# Patient Record
Sex: Male | Born: 1975 | Race: Black or African American | Hispanic: No | Marital: Single | Smoking: Never smoker
Health system: Southern US, Community
[De-identification: ages and names within clinical notes are randomized; demographics above are authoritative.]

---

## 2006-03-15 ENCOUNTER — Emergency Department (HOSPITAL_COMMUNITY): Admission: EM | Admit: 2006-03-15 | Discharge: 2006-03-15 | Payer: Self-pay | Admitting: Emergency Medicine

## 2006-03-19 ENCOUNTER — Ambulatory Visit (HOSPITAL_COMMUNITY): Admission: RE | Admit: 2006-03-19 | Discharge: 2006-03-19 | Payer: Self-pay | Admitting: Orthopedic Surgery

## 2007-10-07 IMAGING — CR DG FINGER MIDDLE 2+V*L*
3 series · 3 of 3 positions shown · non-contrast
Comparison: none

CLINICAL DATA: Left middle finger swelling, status post injury.
 LEFT MIDDLE FINGER ? 3 VIEW:

[x finger pa left *]
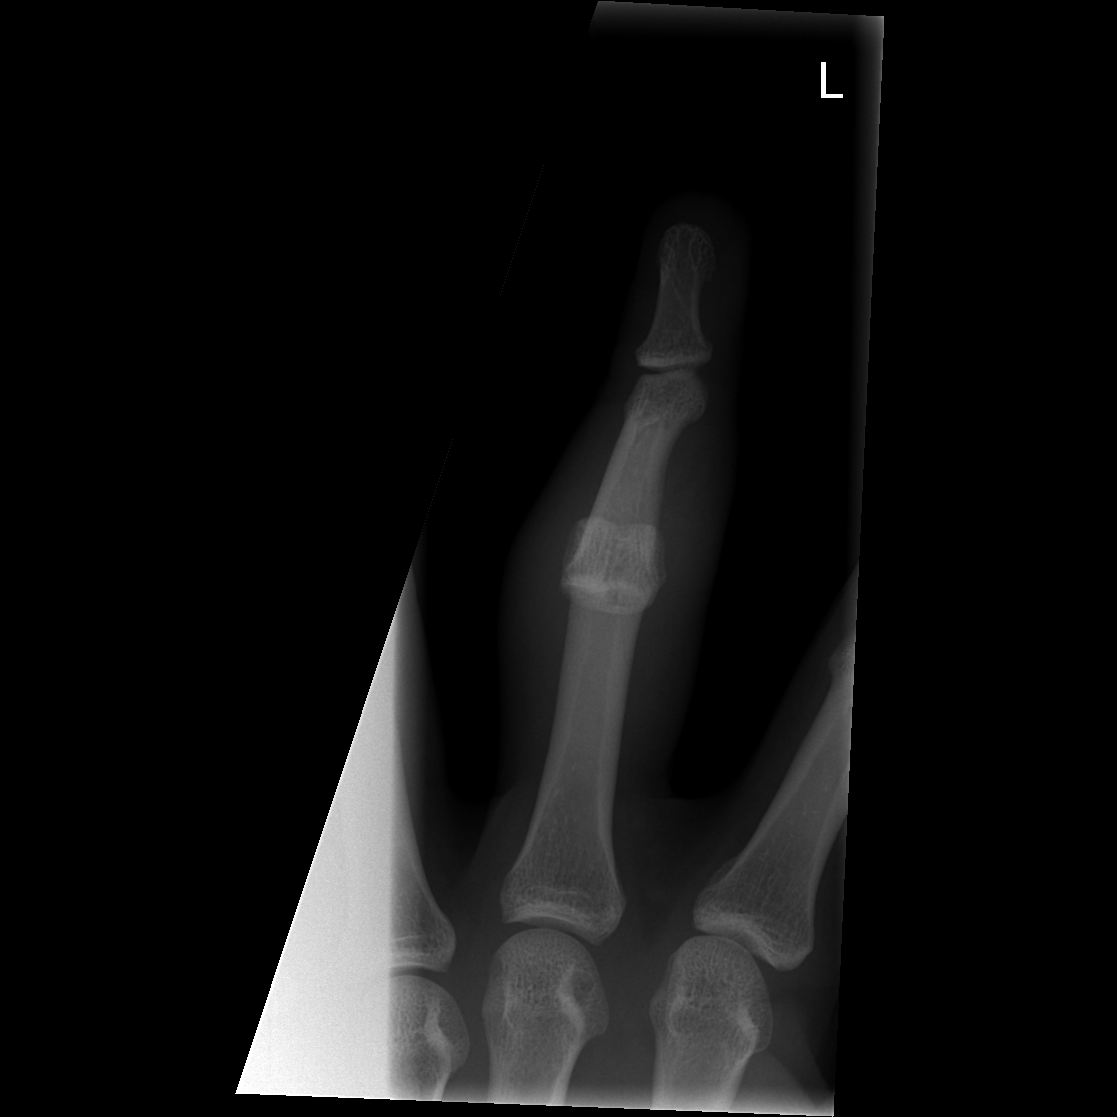

[x finger obl. left *]
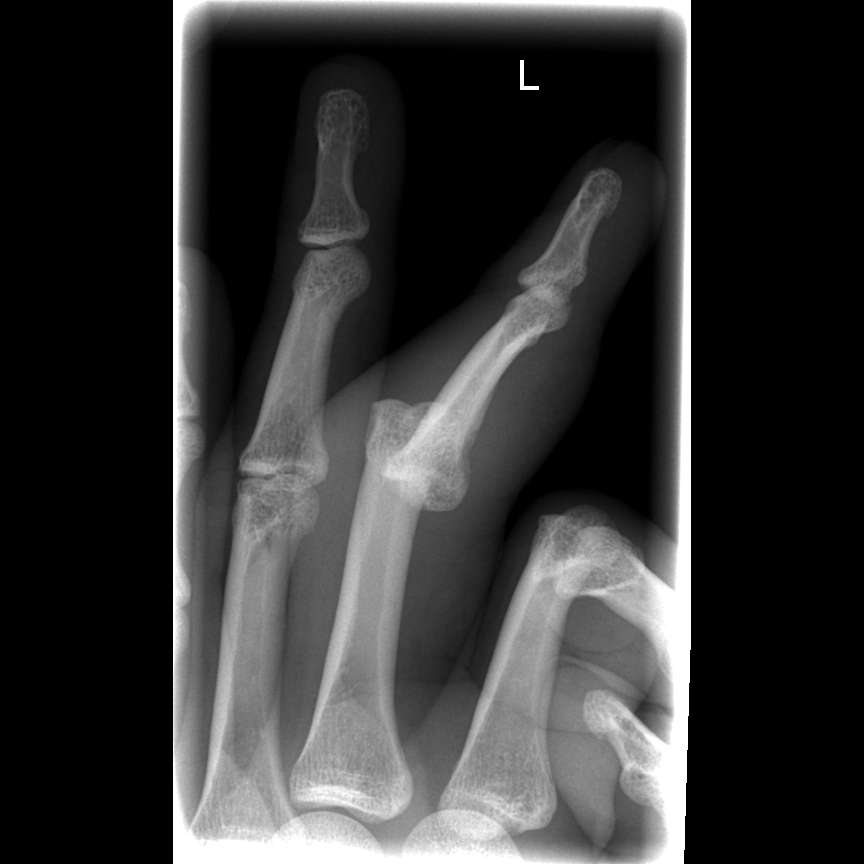

[x finger lateral left]
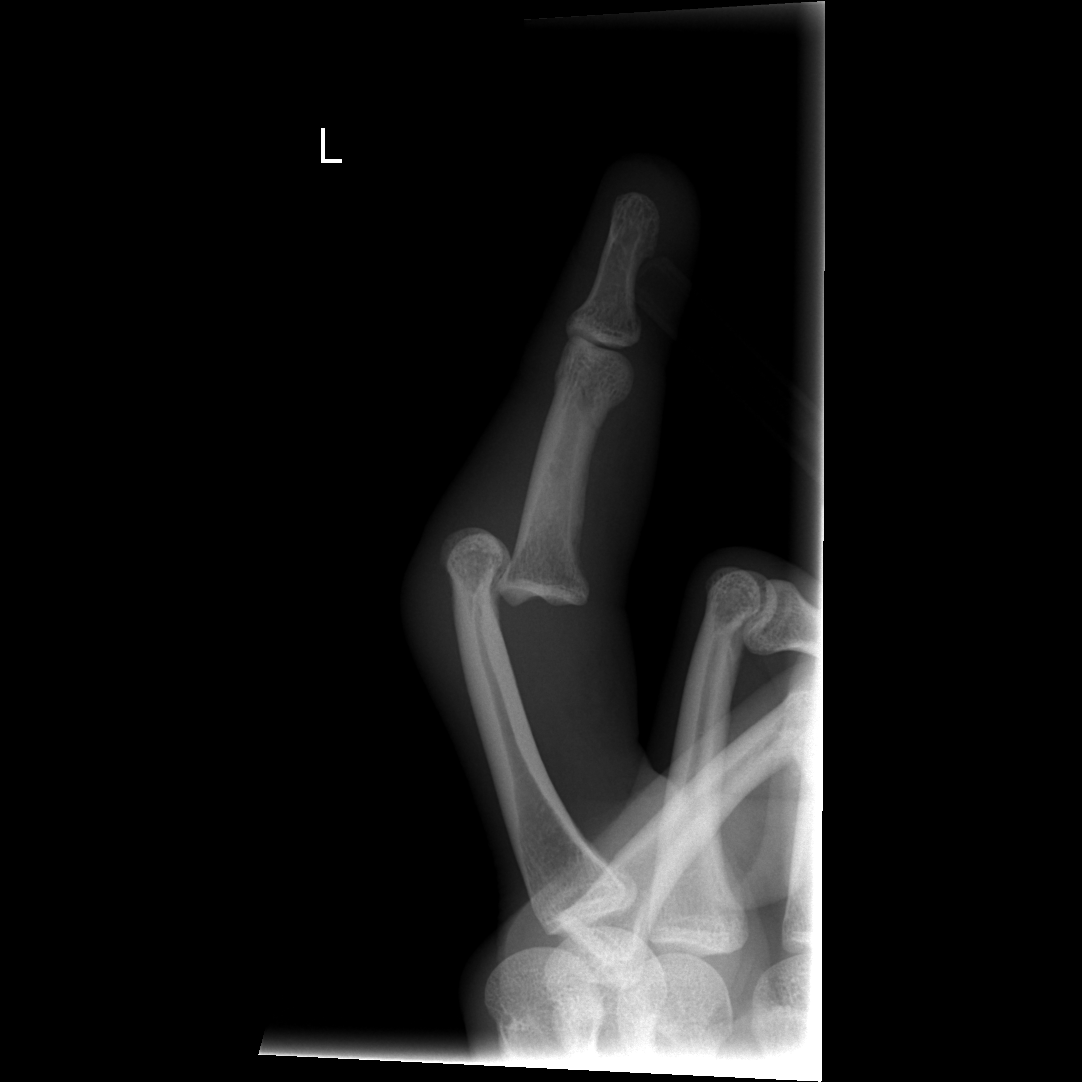

[3 of 3 positions shown; findings below may reference images not displayed]

FINDINGS: There is dislocation at the PIP joint.  There is no evidence of fracture.  The DIP joint is unremarkable.
IMPRESSION: PIP dislocation.

## 2020-02-09 ENCOUNTER — Ambulatory Visit: Payer: Medicaid Other | Admitting: Physician Assistant

## 2020-02-09 ENCOUNTER — Other Ambulatory Visit: Payer: Self-pay

## 2020-02-09 VITALS — BP 140/97 | HR 78 | Temp 98.7°F | Resp 18 | Ht 73.0 in | Wt 180.0 lb

## 2020-02-09 DIAGNOSIS — Z1159 Encounter for screening for other viral diseases: Secondary | ICD-10-CM

## 2020-02-09 DIAGNOSIS — Z114 Encounter for screening for human immunodeficiency virus [HIV]: Secondary | ICD-10-CM

## 2020-02-09 DIAGNOSIS — Z13220 Encounter for screening for lipoid disorders: Secondary | ICD-10-CM | POA: Diagnosis not present

## 2020-02-09 DIAGNOSIS — R030 Elevated blood-pressure reading, without diagnosis of hypertension: Secondary | ICD-10-CM | POA: Diagnosis not present

## 2020-02-09 DIAGNOSIS — Z0000 Encounter for general adult medical examination without abnormal findings: Secondary | ICD-10-CM

## 2020-02-09 NOTE — Progress Notes (Signed)
Patient has eaten today. Patient has taken no medication. Patient has no pain at this time.

## 2020-02-09 NOTE — Patient Instructions (Signed)
Please return to the mobile unit tomorrow morning or Thursday morning for fasting labs and to recheck your blood pressure.  Please check your blood pressure at home, keep a written log, and bring with you to your office visits.  Roney Jaffe, PA-C Physician Assistant [facility] [email or web address]  Health Maintenance, Male Adopting a healthy lifestyle and getting preventive care are important in promoting health and wellness. Ask your health care provider about:  The right schedule for you to have regular tests and exams.  Things you can do on your own to prevent diseases and keep yourself healthy. What should I know about diet, weight, and exercise? Eat a healthy diet   Eat a diet that includes plenty of vegetables, fruits, low-fat dairy products, and lean protein.  Do not eat a lot of foods that are high in solid fats, added sugars, or sodium. Maintain a healthy weight Body mass index (BMI) is a measurement that can be used to identify possible weight problems. It estimates body fat based on height and weight. Your health care provider can help determine your BMI and help you achieve or maintain a healthy weight. Get regular exercise Get regular exercise. This is one of the most important things you can do for your health. Most adults should:  Exercise for at least 150 minutes each week. The exercise should increase your heart rate and make you sweat (moderate-intensity exercise).  Do strengthening exercises at least twice a week. This is in addition to the moderate-intensity exercise.  Spend less time sitting. Even light physical activity can be beneficial. Watch cholesterol and blood lipids Have your blood tested for lipids and cholesterol at 42 years of age, then have this test every 5 years. You may need to have your cholesterol levels checked more often if:  Your lipid or cholesterol levels are high.  You are older  than 42 years of age.  You are at high risk for heart disease. What should I know about cancer screening? Many types of cancers can be detected early and may often be prevented. Depending on your health history and family history, you may need to have cancer screening at various ages. This may include screening for:  Colorectal cancer.  Prostate cancer.  Skin cancer.  Lung cancer. What should I know about heart disease, diabetes, and high blood pressure? Blood pressure and heart disease  High blood pressure causes heart disease and increases the risk of stroke. This is more likely to develop in people who have high blood pressure readings, are of African descent, or are overweight.  Talk with your health care provider about your target blood pressure readings.  Have your blood pressure checked: ? Every 3-5 years if you are 68-87 years of age. ? Every year if you are 75 years old or older.  If you are between the ages of 4 and 7 and are a current or former smoker, ask your health care provider if you should have a one-time screening for abdominal aortic aneurysm (AAA). Diabetes Have regular diabetes screenings. This checks your fasting blood sugar level. Have the screening done:  Once every three years after age 4 if you are at a normal weight and have a low risk for diabetes.  More often and at a younger age if you are overweight or have a high risk for diabetes. What should I know about preventing infection? Hepatitis B If you have a higher risk for hepatitis B, you should be screened for this  virus. Talk with your health care provider to find out if you are at risk for hepatitis B infection. Hepatitis C Blood testing is recommended for:  Everyone born from [birth year] through 1963.  Anyone with known risk factors for hepatitis C. Sexually transmitted infections (STIs)  You should be screened each year for STIs, including gonorrhea and chlamydia, if: ? You are sexually active  and are younger than 42 years of age. ? You are older than 42 years of age and your health care provider tells you that you are at risk for this type of infection. ? Your sexual activity has changed since you were last screened, and you are at increased risk for chlamydia or gonorrhea. Ask your health care provider if you are at risk.  Ask your health care provider about whether you are at high risk for HIV. Your health care provider may recommend a prescription medicine to help prevent HIV infection. If you choose to take medicine to prevent HIV, you should first get tested for HIV. You should then be tested every 3 months for as long as you are taking the medicine. Follow these instructions at home: Lifestyle  Do not use any products that contain nicotine or tobacco, such as cigarettes, e-cigarettes, and chewing tobacco. If you need help quitting, ask your health care provider.  Do not use street drugs.  Do not share needles.  Ask your health care provider for help if you need support or information about quitting drugs. Alcohol use  Do not drink alcohol if your health care provider tells you not to drink.  If you drink alcohol: ? Limit how much you have to 0-2 drinks a day. ? Be aware of how much alcohol is in your drink. In the [place] one drink equals one 12 oz bottle of beer (355 mL), one 5 oz glass of wine (148 mL), or one 1 oz glass of hard liquor (44 mL). General instructions  Schedule regular health, dental, and eye exams.  Stay current with your vaccines.  Tell your health care provider if: ? You often feel depressed. ? You have ever been abused or do not feel safe at home. Summary  Adopting a healthy lifestyle and getting preventive care are important in promoting health and wellness.  Follow your health care provider's instructions about healthy diet, exercising, and getting tested or screened for diseases.  Follow your health care provider's instructions on  monitoring your cholesterol and blood pressure. This information is not intended to replace advice given to you by your health care provider. Make sure you discuss any questions you have with your health care provider. Document Revised: 01/22/2018 Document Reviewed: 01/22/2018 Elsevier Patient Education  2020 [employer].

## 2020-02-09 NOTE — Progress Notes (Signed)
New Patient Office Visit  Subjective:  Patient ID: Evan Wheeler, male    DOB: [birth year]  Age: 44 y.o. MRN: [id number]  CC:  Chief Complaint  Patient presents with   Annual Exam    HPI Evan Wheeler presents for physical without complaint.  States that his last physical was approximately 3 to 4 years ago, during an incarceration.  Reports that everything was within normal limits at that time.  States that he does not check his blood pressure at home, has not previously been diagnosed with hypertension.  Denies any hypertensive symptoms.   History reviewed. No pertinent past medical history.  History reviewed. No pertinent surgical history.  History reviewed. No pertinent family history.  Social History   Socioeconomic History   Marital status: Single    Spouse name: Not on file   Number of children: Not on file   Years of education: Not on file   Highest education level: Not on file  Occupational History   Not on file  Tobacco Use   Smoking status: Never Smoker   Smokeless tobacco: Never Used  Substance and Sexual Activity   Alcohol use: Yes   Drug use: Not on file   Sexual activity: Not Currently  Other Topics Concern   Not on file  Social History Narrative   Not on file   Social Determinants of Health   Financial Resource Strain: Not on file  Food Insecurity: Not on file  Transportation Needs: Not on file  Physical Activity: Not on file  Stress: Not on file  Social Connections: Not on file  Intimate Partner Violence: Not on file    ROS Review of Systems  Constitutional: Negative for chills and fatigue.  HENT: Negative.   Eyes: Negative.   Respiratory: Negative for shortness of breath and wheezing.   Cardiovascular: Negative for chest pain.  Gastrointestinal: Negative for abdominal pain and constipation.  Endocrine: Negative.   Genitourinary: Negative for difficulty urinating.  Musculoskeletal: Negative.   Skin: Negative.    Allergic/Immunologic: Negative.   Neurological: Negative.   Hematological: Negative.   Psychiatric/Behavioral: Negative for sleep disturbance.    Objective:   Today's Vitals: BP (!) 140/97 (BP Location: Left Arm, Patient Position: Sitting, Cuff Size: Normal)    Pulse 78    Temp 98.7 F (37.1 C) (Oral)    Resp 18    Ht 6\' 1"  (1.854 m)    Wt 180 lb (81.6 kg)    SpO2 100%    BMI 23.75 kg/m   Physical Exam Vitals and nursing note reviewed.   BP (!) 140/97 (BP Location: Left Arm, Patient Position: Sitting, Cuff Size: Normal)    Pulse 78    Temp 98.7 F (37.1 C) (Oral)    Resp 18    Ht 6\' 1"  (1.854 m)    Wt 180 lb (81.6 kg)    SpO2 100%    BMI 23.75 kg/m   General Appearance:    Alert, cooperative, no distress, appears stated age  Head:    Normocephalic, without obvious abnormality, atraumatic  Eyes:    PERRL, conjunctiva/corneas clear, EOM's intact, fundi    benign, both eyes       Ears:    Normal TM's and external ear canals, both ears  Nose:   Nares normal, septum midline, mucosa normal, no drainage   or sinus tenderness  Throat:   Lips, mucosa, and tongue normal; teeth and gums normal  Neck:   Supple, symmetrical, trachea midline, no  adenopathy;       thyroid:  No enlargement/tenderness/nodules; no carotid   bruit or JVD  Back:     Symmetric, no curvature, ROM normal, no CVA tenderness  Lungs:     Clear to auscultation bilaterally, respirations unlabored  Chest wall:    No tenderness or deformity  Heart:    Regular rate and rhythm, S1 and S2 normal, no murmur, rub   or gallop  Abdomen:     Soft, non-tender, bowel sounds active all four quadrants,    no masses, no organomegaly        Extremities:   Extremities normal, atraumatic, no cyanosis or edema  Pulses:   2+ and symmetric all extremities  Skin:   Skin color, texture, turgor normal, no rashes or lesions  Lymph nodes:   Cervical, supraclavicular, and axillary nodes normal  Neurologic:   CNII-XII intact. Normal strength,  sensation and reflexes      throughout     Assessment & Plan:   Problem List Items Addressed This Visit      Other   Elevated blood pressure reading in office without diagnosis of hypertension   Relevant Orders   TSH    Other Visit Diagnoses    Wellness examination    -  Primary   Relevant Orders   CBC with Differential/Platelet   Comp. Metabolic Panel (12)   Screening, lipid       Relevant Orders   Lipid panel   Screening for HIV without presence of risk factors       Relevant Orders   HIV antibody (with reflex)   Encounter for HCV screening test for low risk patient       Relevant Orders   HCV Ab w/Rflx to Verification      No outpatient encounter medications on file as of 02/09/2020.   No facility-administered encounter medications on file as of 02/09/2020.  1. Wellness examination Patient to return to mobile unit for fasting labs.  Patient was given an appointment to establish care at [facility] on March 15, 2020 - CBC with Differential/Platelet; Future - Comp. Metabolic Panel (12); Future  2. Elevated blood pressure reading in office without diagnosis of hypertension Patient education given. Encouraged patient to check BP at home, keep written log, bring log to next office visit.  - TSH; Future  3. Screening, lipid - Lipid panel; Future  4. Screening for HIV without presence of risk factors  - HIV antibody (with reflex); Future  5. Encounter for HCV screening test for low risk patient  - HCV Ab w/Rflx to Verification; Future   I have reviewed the patient's medical history (PMH, PSH, Social History, Family History, Medications, and allergies) , and have been updated if relevant. I spent 30 minutes reviewing chart and  face to face time with patient.    Follow-up: Return in about 1 day (around 02/10/2020) for Fasting  labs.   Kasandra Knudsen Mayers, PA-C

## 2020-02-10 ENCOUNTER — Other Ambulatory Visit: Payer: Self-pay | Admitting: *Deleted

## 2020-02-10 ENCOUNTER — Other Ambulatory Visit: Payer: Medicaid Other

## 2020-02-10 DIAGNOSIS — Z0000 Encounter for general adult medical examination without abnormal findings: Secondary | ICD-10-CM

## 2020-02-10 DIAGNOSIS — Z1159 Encounter for screening for other viral diseases: Secondary | ICD-10-CM

## 2020-02-10 DIAGNOSIS — Z13220 Encounter for screening for lipoid disorders: Secondary | ICD-10-CM

## 2020-02-10 DIAGNOSIS — R030 Elevated blood-pressure reading, without diagnosis of hypertension: Secondary | ICD-10-CM

## 2020-02-10 DIAGNOSIS — Z114 Encounter for screening for human immunodeficiency virus [HIV]: Secondary | ICD-10-CM

## 2020-02-10 NOTE — Progress Notes (Signed)
Patient tolerated blood draw well today. 

## 2020-02-11 LAB — HCV INTERPRETATION

## 2020-02-11 LAB — LIPID PANEL
Chol/HDL Ratio: 2.6 ratio (ref 0.0–5.0)
Cholesterol, Total: 230 mg/dL — ABNORMAL HIGH (ref 100–199)
HDL: 90 mg/dL (ref >39)
LDL Chol Calc (NIH): 128 mg/dL — ABNORMAL HIGH (ref 0–99)
Triglycerides: 70 mg/dL (ref 0–149)
VLDL Cholesterol Cal: 12 mg/dL (ref 5–40)

## 2020-02-11 LAB — COMP. METABOLIC PANEL (12)
AST: 21 IU/L (ref 0–40)
Albumin/Globulin Ratio: 1.4 (ref 1.2–2.2)
Albumin: 4.2 g/dL (ref 4.0–5.0)
Alkaline Phosphatase: 67 IU/L (ref 44–121)
BUN/Creatinine Ratio: 14 (ref 9–20)
BUN: 16 mg/dL (ref 6–24)
Bilirubin Total: 2.4 mg/dL — ABNORMAL HIGH (ref 0.0–1.2)
Calcium: 9.8 mg/dL (ref 8.7–10.2)
Chloride: 104 mmol/L (ref 96–106)
Creatinine, Ser: 1.16 mg/dL (ref 0.76–1.27)
GFR calc Af Amer: 89 mL/min/{1.73_m2} (ref >59)
GFR calc non Af Amer: 77 mL/min/{1.73_m2} (ref >59)
Globulin, Total: 3 g/dL (ref 1.5–4.5)
Glucose: 102 mg/dL — ABNORMAL HIGH (ref 65–99)
Potassium: 3.9 mmol/L (ref 3.5–5.2)
Sodium: 142 mmol/L (ref 134–144)
Total Protein: 7.2 g/dL (ref 6.0–8.5)

## 2020-02-11 LAB — CBC WITH DIFFERENTIAL/PLATELET
Basophils Absolute: 0 10*3/uL (ref 0.0–0.2)
Basos: 1 %
EOS (ABSOLUTE): 0 10*3/uL (ref 0.0–0.4)
Eos: 1 %
Hematocrit: 48.6 % (ref 37.5–51.0)
Hemoglobin: 16.6 g/dL (ref 13.0–17.7)
Immature Grans (Abs): 0 10*3/uL (ref 0.0–0.1)
Immature Granulocytes: 0 %
Lymphocytes Absolute: 1.4 10*3/uL (ref 0.7–3.1)
Lymphs: 33 %
MCH: 31 pg (ref 26.6–33.0)
MCHC: 34.2 g/dL (ref 31.5–35.7)
MCV: 91 fL (ref 79–97)
Monocytes Absolute: 0.4 10*3/uL (ref 0.1–0.9)
Monocytes: 9 %
Neutrophils Absolute: 2.4 10*3/uL (ref 1.4–7.0)
Neutrophils: 56 %
Platelets: 147 10*3/uL — ABNORMAL LOW (ref 150–450)
RBC: 5.35 x10E6/uL (ref 4.14–5.80)
RDW: 12.9 % (ref 11.6–15.4)
WBC: 4.2 10*3/uL (ref 3.4–10.8)

## 2020-02-11 LAB — TSH: TSH: 0.947 u[IU]/mL (ref 0.450–4.500)

## 2020-02-11 LAB — HCV AB W/RFLX TO VERIFICATION: HCV Ab: <0.1 s/co ratio (ref 0.0–0.9)

## 2020-02-11 LAB — HIV ANTIBODY (ROUTINE TESTING W REFLEX): HIV Screen 4th Generation wRfx: NONREACTIVE

## 2020-02-15 NOTE — Progress Notes (Signed)
Patient needs appointment with [facility] this week to discuss labs

## 2020-02-29 ENCOUNTER — Telehealth: Payer: Self-pay | Admitting: *Deleted

## 2020-02-29 NOTE — Telephone Encounter (Signed)
Medical Assistant left message on patient's home and cell voicemail. Voicemail states to give a call back to Nubia with [facility] at [phone]. 

## 2020-03-15 ENCOUNTER — Encounter: Payer: Self-pay | Admitting: Family

## 2020-03-15 NOTE — Progress Notes (Signed)
Patient did not show for appointment.   

## 2020-03-17 ENCOUNTER — Telehealth: Payer: Self-pay | Admitting: *Deleted

## 2020-03-17 NOTE — Telephone Encounter (Signed)
MA UTR patient regarding labs. Communication letter has been drafted and mailed to patient.

## 2020-03-17 NOTE — Telephone Encounter (Signed)
-----   Message from Roney Jaffe, [place] sent at 03/15/2020  1:00 PM EST ----- Please send letter to patient requesting follow-up regarding lab results.

## 2020-03-28 ENCOUNTER — Telehealth: Payer: Self-pay | Admitting: *Deleted

## 2020-03-28 NOTE — Telephone Encounter (Signed)
MA unable to reach patient via telephone or mailing address. Communication letter was sent on 03/17/20 and returned to sender on 03/28/20

## 2023-10-13 ENCOUNTER — Encounter (HOSPITAL_COMMUNITY): Payer: Self-pay | Admitting: Radiology

## 2023-10-13 ENCOUNTER — Emergency Department (HOSPITAL_COMMUNITY): Payer: Self-pay

## 2023-10-13 ENCOUNTER — Emergency Department (HOSPITAL_COMMUNITY)
Admission: EM | Admit: 2023-10-13 | Discharge: 2023-10-13 | Disposition: A | Discharge status: Home / Self Care | Payer: Self-pay | Attending: Emergency Medicine | Admitting: Emergency Medicine

## 2023-10-13 ENCOUNTER — Other Ambulatory Visit: Payer: Self-pay

## 2023-10-13 DIAGNOSIS — W25XXXA Contact with sharp glass, initial encounter: Secondary | ICD-10-CM | POA: Insufficient documentation

## 2023-10-13 DIAGNOSIS — Z23 Encounter for immunization: Secondary | ICD-10-CM | POA: Insufficient documentation

## 2023-10-13 DIAGNOSIS — S51811A Laceration without foreign body of right forearm, initial encounter: Secondary | ICD-10-CM | POA: Insufficient documentation

## 2023-10-13 DIAGNOSIS — E876 Hypokalemia: Secondary | ICD-10-CM | POA: Insufficient documentation

## 2023-10-13 LAB — BASIC METABOLIC PANEL WITH GFR
Anion gap: 13 (ref 5–15)
BUN: 11 mg/dL (ref 6–20)
CO2: 22 mmol/L (ref 22–32)
Calcium: 9 mg/dL (ref 8.9–10.3)
Chloride: 107 mmol/L (ref 98–111)
Creatinine, Ser: 1.04 mg/dL (ref 0.61–1.24)
GFR, Estimated: >60 mL/min (ref >60)
Glucose, Bld: 105 mg/dL — ABNORMAL HIGH (ref 70–99)
Potassium: 3.3 mmol/L — ABNORMAL LOW (ref 3.5–5.1)
Sodium: 142 mmol/L (ref 135–145)

## 2023-10-13 LAB — CBC WITH DIFFERENTIAL/PLATELET
Abs Immature Granulocytes: 0.03 K/uL (ref 0.00–0.07)
Basophils Absolute: 0 K/uL (ref 0.0–0.1)
Basophils Relative: 0 %
Eosinophils Absolute: 0 K/uL (ref 0.0–0.5)
Eosinophils Relative: 0 %
HCT: 44.5 % (ref 39.0–52.0)
Hemoglobin: 14.7 g/dL (ref 13.0–17.0)
Immature Granulocytes: 0 %
Lymphocytes Relative: 27 %
Lymphs Abs: 2 K/uL (ref 0.7–4.0)
MCH: 31.7 pg (ref 26.0–34.0)
MCHC: 33 g/dL (ref 30.0–36.0)
MCV: 96.1 fL (ref 80.0–100.0)
Monocytes Absolute: 0.6 K/uL (ref 0.1–1.0)
Monocytes Relative: 8 %
Neutro Abs: 4.7 K/uL (ref 1.7–7.7)
Neutrophils Relative %: 65 %
Platelets: 181 K/uL (ref 150–400)
RBC: 4.63 MIL/uL (ref 4.22–5.81)
RDW: 12.7 % (ref 11.5–15.5)
WBC: 7.3 K/uL (ref 4.0–10.5)
nRBC: 0 % (ref 0.0–0.2)

## 2023-10-13 MED ORDER — TETANUS-DIPHTH-ACELL PERTUSSIS 5-2.5-18.5 LF-MCG/0.5 IM SUSY
0.5000 mL | PREFILLED_SYRINGE | Freq: Once | INTRAMUSCULAR | Status: AC
  Administered 2023-10-13: 0.5 mL via INTRAMUSCULAR
  Filled 2023-10-13: qty 0.5

## 2023-10-13 MED ORDER — LIDOCAINE-EPINEPHRINE-TETRACAINE (LET) TOPICAL GEL
3.0000 mL | Freq: Once | TOPICAL | Status: AC
  Administered 2023-10-13: 3 mL via TOPICAL
  Filled 2023-10-13: qty 3

## 2023-10-13 MED ORDER — LIDOCAINE HCL (PF) 1 % IJ SOLN
5.0000 mL | Freq: Once | INTRAMUSCULAR | Status: AC
  Administered 2023-10-13: 5 mL
  Filled 2023-10-13: qty 5

## 2023-10-13 NOTE — ED Notes (Signed)
 X-ray at bedside.
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
## 2023-10-13 NOTE — Discharge Instructions (Addendum)
 You were seen today for a laceration to your right forearm.  It was cleaned and had 3 sutures placed.  Please follow-up with urgent care, primary care or return to the ED in the next 7 days for suture removal.  Watch out for signs of infection which would include fever, spreading warmth or redness from the wound site, foul-smelling or white drainage, increased swelling or uncontrolled pain.
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
## 2023-10-26 ENCOUNTER — Other Ambulatory Visit: Payer: Self-pay

## 2023-10-26 ENCOUNTER — Emergency Department (HOSPITAL_COMMUNITY)
Admission: EM | Admit: 2023-10-26 | Discharge: 2023-10-26 | Disposition: A | Discharge status: Home / Self Care | Payer: Self-pay | Attending: Emergency Medicine | Admitting: Emergency Medicine

## 2023-10-26 ENCOUNTER — Emergency Department (HOSPITAL_COMMUNITY): Payer: Self-pay

## 2023-10-26 ENCOUNTER — Encounter (HOSPITAL_COMMUNITY): Payer: Self-pay

## 2023-10-26 DIAGNOSIS — Z189 Retained foreign body fragments, unspecified material: Secondary | ICD-10-CM

## 2023-10-26 DIAGNOSIS — Z4802 Encounter for removal of sutures: Secondary | ICD-10-CM | POA: Insufficient documentation

## 2023-10-26 DIAGNOSIS — Z5189 Encounter for other specified aftercare: Secondary | ICD-10-CM

## 2023-10-26 LAB — COMPREHENSIVE METABOLIC PANEL WITH GFR
ALT: 26 U/L (ref 0–44)
AST: 31 U/L (ref 15–41)
Albumin: 3.5 g/dL (ref 3.5–5.0)
Alkaline Phosphatase: 79 U/L (ref 38–126)
Anion gap: 9 (ref 5–15)
BUN: 17 mg/dL (ref 6–20)
CO2: 27 mmol/L (ref 22–32)
Calcium: 9.2 mg/dL (ref 8.9–10.3)
Chloride: 105 mmol/L (ref 98–111)
Creatinine, Ser: 1.24 mg/dL (ref 0.61–1.24)
GFR, Estimated: >60 mL/min (ref >60)
Glucose, Bld: 102 mg/dL — ABNORMAL HIGH (ref 70–99)
Potassium: 3.6 mmol/L (ref 3.5–5.1)
Sodium: 141 mmol/L (ref 135–145)
Total Bilirubin: 0.8 mg/dL (ref 0.0–1.2)
Total Protein: 6.8 g/dL (ref 6.5–8.1)

## 2023-10-26 LAB — I-STAT CHEM 8, ED
BUN: 19 mg/dL (ref 6–20)
Calcium, Ion: 1.22 mmol/L (ref 1.15–1.40)
Chloride: 105 mmol/L (ref 98–111)
Creatinine, Ser: 1.3 mg/dL — ABNORMAL HIGH (ref 0.61–1.24)
Glucose, Bld: 102 mg/dL — ABNORMAL HIGH (ref 70–99)
HCT: 40 % (ref 39.0–52.0)
Hemoglobin: 13.6 g/dL (ref 13.0–17.0)
Potassium: 3.6 mmol/L (ref 3.5–5.1)
Sodium: 143 mmol/L (ref 135–145)
TCO2: 27 mmol/L (ref 22–32)

## 2023-10-26 LAB — CBC
HCT: 40 % (ref 39.0–52.0)
Hemoglobin: 13.1 g/dL (ref 13.0–17.0)
MCH: 31.6 pg (ref 26.0–34.0)
MCHC: 32.8 g/dL (ref 30.0–36.0)
MCV: 96.6 fL (ref 80.0–100.0)
Platelets: 174 K/uL (ref 150–400)
RBC: 4.14 MIL/uL — ABNORMAL LOW (ref 4.22–5.81)
RDW: 12.5 % (ref 11.5–15.5)
WBC: 5.4 K/uL (ref 4.0–10.5)
nRBC: 0 % (ref 0.0–0.2)

## 2023-10-26 LAB — I-STAT CG4 LACTIC ACID, ED: Lactic Acid, Venous: 0.7 mmol/L (ref 0.5–1.9)

## 2023-10-26 MED ORDER — CEPHALEXIN 500 MG PO CAPS: 500.0000 mg | ORAL_CAPSULE | Freq: Four times a day (QID) | ORAL | 0 refills | Status: DC

## 2023-10-26 MED ORDER — LIDOCAINE HCL (PF) 1 % IJ SOLN
5.0000 mL | Freq: Once | INTRAMUSCULAR | Status: AC
  Administered 2023-10-26: 5 mL
  Filled 2023-10-26: qty 5

## 2023-10-26 MED ORDER — CEPHALEXIN 500 MG PO CAPS: 500.0000 mg | ORAL_CAPSULE | Freq: Four times a day (QID) | ORAL | 0 refills | Status: AC

## 2023-10-26 NOTE — ED Triage Notes (Signed)
 Pt arrive POV from home for a wound check. Stats he had stiches done 10/13/23 I couldn't come to get them removed. Now he is concern for infection. Wound looks swollen and bleeding.

## 2023-10-26 NOTE — Discharge Instructions (Addendum)
 I would place a bandage on the affected area and change it at least once daily, or more frequently if it becomes soiled. When changing bandage, clean the wound thoroughly with soap and water, and place a thin layer of Polysporin or Neosporin directly over top of the wound before replacing bandage. Please monitor for any signs of developing infection including worsening redness, pain, pus draining from the affected site.  If you are concerned about any developing infection I recommend that you return for further evaluation for management.  Please take the entire course of antibiotics that prescribed.  Please call the number I have placed above to schedule an appointment with the hand surgeon, for wound recheck.  Please return if you have significant worsening redness, pain, swelling, pus draining from the affected site concerning for worsening infection.

## 2023-10-26 NOTE — ED Provider Triage Note (Signed)
 Emergency Medicine Provider Triage Evaluation Note  Evan Wheeler , a 48 y.o. male  was evaluated in triage.  Pt complains of right forearm wound. Sutures placed 09/2023, never returned for removal, now swollen, oozing blood.   Review of Systems  Positive:  Negative:   Physical Exam  BP (!) 140/97   Pulse 70   Temp 98 F (36.7 C) (Oral)   Resp 18   Ht 6' 1 (1.854 m)   Wt 79.4 kg   SpO2 100%   BMI 23.09 kg/m  Gen:   Awake, no distress   Resp:  Normal effort  MSK:   Moves extremities without difficulty, area to right forearm appears raised, sutures in place, open and oozing blood.   Other:    Medical Decision Making  Medically screening exam initiated at 6:23 AM.  Appropriate orders placed.  Evan Wheeler was informed that the remainder of the evaluation will be completed by another provider, this initial triage assessment does not replace that evaluation, and the importance of remaining in the ED until their evaluation is complete.     Beverley Leita LABOR, PA-C 10/26/23 [id number]

## 2023-10-26 NOTE — ED Provider Notes (Signed)
  EMERGENCY DEPARTMENT AT [facility] Provider Note   CSN: [id number] Arrival date & time: 10/26/23  0120     Patient presents with: Wound Check   Evan Wheeler is a 48 y.o. male with overall noncontributory past medical history who presents from home for wound check.  He had stitches replaced on 10/13/2023.  He reports that he could not come get the removed immediately.  He is here now around 2 weeks status post the incident.  He is concerned about infection.  There is some swelling and intermittent bleeding from the wound.    Wound Check       Prior to Admission medications   Medication Sig Start Date End Date Taking? Authorizing Provider  cephALEXin  (KEFLEX ) 500 MG capsule Take 1 capsule (500 mg total) by mouth 4 (four) times daily. 10/26/23   Amie Cowens H, PA-C    Allergies: Patient has no known allergies.    Review of Systems  All other systems reviewed and are negative.   Updated Vital Signs BP (!) 138/98   Pulse 62   Temp 98 F (36.7 C) (Oral)   Resp 16   Ht 6' 1 (1.854 m)   Wt 79.4 kg   SpO2 100%   BMI 23.09 kg/m   Physical Exam Vitals and nursing note reviewed.  Constitutional:      General: He is not in acute distress.    Appearance: Normal appearance.  HENT:     Head: Normocephalic and atraumatic.  Eyes:     General:        Right eye: No discharge.        Left eye: No discharge.  Cardiovascular:     Rate and Rhythm: Normal rate and regular rhythm.     Heart sounds: No murmur heard.    No friction rub. No gallop.  Pulmonary:     Effort: Pulmonary effort is normal.     Breath sounds: Normal breath sounds.  Abdominal:     General: Bowel sounds are normal.     Palpations: Abdomen is soft.  Musculoskeletal:     Comments: Wound with minor dehiscence, soft tissue swelling, probed to assess for foreign bodies revealing  Skin:    General: Skin is warm and dry.     Capillary Refill: Capillary refill takes less than 2  seconds.  Neurological:     Mental Status: He is alert and oriented to person, place, and time.  Psychiatric:        Mood and Affect: Mood normal.        Behavior: Behavior normal.     (all labs ordered are listed, but only abnormal results are displayed) Labs Reviewed  CBC - Abnormal; Notable for the following components:      Result Value   RBC 4.14 (*)    All other components within normal limits  COMPREHENSIVE METABOLIC PANEL WITH GFR - Abnormal; Notable for the following components:   Glucose, Bld 102 (*)    All other components within normal limits  I-STAT CHEM 8, ED - Abnormal; Notable for the following components:   Creatinine, Ser 1.30 (*)    Glucose, Bld 102 (*)    All other components within normal limits  I-STAT CG4 LACTIC ACID, ED  I-STAT CG4 LACTIC ACID, ED    EKG: None  Radiology: DG Forearm Right Result Date: 10/26/2023 EXAM: 3 VIEW(S) XRAY OF THE RIGHT FOREARM 10/26/2023 02:24:50 AM COMPARISON: 10/13/2023 CLINICAL HISTORY: Infection. Pt arrive POV from  home for a wound check. Stats he had stiches done 10/13/23 I couldn't come to get them removed. Now he is concern for infection. Wound looks swollen and bleeding. FINDINGS: BONES AND JOINTS: No acute fracture. No focal osseous lesion. No joint dislocation. SOFT TISSUES: Soft tissue swelling of the radial aspect of the mid right forearm. Three punctate radiopaque foreign bodies along the radial aspect of the right forearm, in the region of soft tissue swelling. Foreign bodies present on prior study. IMPRESSION: 1. Soft tissue swelling of the radial aspect of the mid right forearm, likely related to the reported infection. 2. Three punctate radiopaque foreign bodies along the radial aspect of the right forearm, in the region of soft tissue swelling, present on prior study. Electronically signed by: Dorethia Molt MD 10/26/2023 02:40 AM EDT RP Workstation: [id number]     Suture Removal  Date/Time: 10/26/2023 6:59  AM  Performed by: Rosan Sherlean DEL, PA-C Authorized by: Rosan Sherlean DEL, PA-C   Consent:    Consent obtained:  Verbal   Consent given by:  Patient   Risks, benefits, and alternatives were discussed: yes     Risks discussed:  Bleeding   Alternatives discussed:  No treatment Universal protocol:    Procedure explained and questions answered to patient or proxy's satisfaction: yes     Patient identity confirmed:  Verbally with patient Location:    Location:  Upper extremity   Upper extremity location:  Wrist   Wrist location:  R wrist Procedure details:    Wound appearance:  Nonpurulent, red and tender   Number of sutures removed:  3 Post-procedure details:    Post-removal:  Dressing applied   Procedure completion:  Tolerated    Medications Ordered in the ED  lidocaine  (PF) (XYLOCAINE ) 1 % injection 5 mL (5 mLs Infiltration Given by Other 10/26/23 9342)                                    Medical Decision Making Risk Prescription drug management.   This is an overall well-appearing 48 year old male who presents for a wound check of his right forearm.  At time of injury 2 weeks ago x-ray was read as having no retained foreign bodies, Idabel interpreted x-ray today which shows some soft tissue swelling and multiple radiopaque foreign bodies.  On exam the patient appears to have formed granuloma surrounding the wound, there is no pus, but there is a small amount of dehiscence.  Bedside exploration of the wound under anesthesia did not reveal any foreign bodies.  The wound was cleaned, sutures removed, and redressed.  Encouraged the patient to follow-up closely with hand surgery, placed on antibiotics, extensive return precautions given for signs of any developing infection.  May need further evaluation outpatient for possible removal of foreign bodies under radiography.  Final diagnoses:  Visit for wound check  Visit for suture removal  Retained foreign body    ED  Discharge Orders          Ordered    cephALEXin  (KEFLEX ) 500 MG capsule  4 times daily,   Status:  Discontinued        10/26/23 0657    cephALEXin  (KEFLEX ) 500 MG capsule  4 times daily        10/26/23 0657               Micki Cassel, Sherlean DEL, PA-C 10/26/23 0700    Franklyn Gills  N, MD 10/26/23 2315

## 2023-11-01 ENCOUNTER — Encounter (HOSPITAL_BASED_OUTPATIENT_CLINIC_OR_DEPARTMENT_OTHER): Payer: Self-pay | Admitting: Internal Medicine

## 2023-11-07 ENCOUNTER — Encounter (HOSPITAL_BASED_OUTPATIENT_CLINIC_OR_DEPARTMENT_OTHER): Payer: Self-pay | Attending: Internal Medicine | Admitting: Internal Medicine

## 2023-11-07 DIAGNOSIS — S41111A Laceration without foreign body of right upper arm, initial encounter: Secondary | ICD-10-CM
